# Patient Record
Sex: Female | Born: 1970 | Race: White | Hispanic: No | Marital: Married
Health system: Southern US, Community
[De-identification: ages and names within clinical notes are randomized; demographics above are authoritative.]

---

## 2004-11-16 ENCOUNTER — Ambulatory Visit: Payer: Self-pay | Admitting: Unknown Physician Specialty

## 2004-12-09 ENCOUNTER — Ambulatory Visit: Payer: Self-pay | Admitting: Unknown Physician Specialty

## 2013-06-29 ENCOUNTER — Inpatient Hospital Stay: Payer: Self-pay | Admitting: Orthopedic Surgery

## 2013-06-29 DIAGNOSIS — I10 Essential (primary) hypertension: Secondary | ICD-10-CM

## 2013-06-29 LAB — BASIC METABOLIC PANEL
Calcium, Total: 8.6 mg/dL (ref 8.5–10.1)
EGFR (African American): >60
EGFR (Non-African Amer.): >60
Osmolality: 292 (ref 275–301)
Potassium: 4.5 mmol/L (ref 3.5–5.1)
Sodium: 140 mmol/L (ref 136–145)

## 2013-06-29 LAB — CBC
HCT: 36.5 % (ref 35.0–47.0)
HGB: 12 g/dL (ref 12.0–16.0)
MCH: 30.3 pg (ref 26.0–34.0)
MCHC: 32.8 g/dL (ref 32.0–36.0)
Platelet: 425 10*3/uL (ref 150–440)
RBC: 3.95 10*6/uL (ref 3.80–5.20)
RDW: 15.7 % — ABNORMAL HIGH (ref 11.5–14.5)

## 2013-07-01 LAB — PATHOLOGY REPORT

## 2013-08-14 ENCOUNTER — Emergency Department: Payer: Self-pay | Admitting: Emergency Medicine

## 2013-08-14 LAB — BASIC METABOLIC PANEL
Anion Gap: 8 (ref 7–16)
Calcium, Total: 8.8 mg/dL (ref 8.5–10.1)
Chloride: 100 mmol/L (ref 98–107)
Co2: 28 mmol/L (ref 21–32)
EGFR (Non-African Amer.): >60
Glucose: 168 mg/dL — ABNORMAL HIGH (ref 65–99)
Potassium: 3.6 mmol/L (ref 3.5–5.1)
Sodium: 136 mmol/L (ref 136–145)

## 2013-08-14 LAB — CBC WITH DIFFERENTIAL/PLATELET
Basophil #: 0.1 10*3/uL (ref 0.0–0.1)
Eosinophil #: 0.1 10*3/uL (ref 0.0–0.7)
Eosinophil %: 1.1 %
HCT: 39.4 % (ref 35.0–47.0)
Lymphocyte #: 2.1 10*3/uL (ref 1.0–3.6)
Monocyte #: 0.6 x10 3/mm (ref 0.2–0.9)
Neutrophil #: 7.3 10*3/uL — ABNORMAL HIGH (ref 1.4–6.5)
Platelet: 598 10*3/uL — ABNORMAL HIGH (ref 150–440)
RBC: 4.46 10*6/uL (ref 3.80–5.20)

## 2013-08-31 ENCOUNTER — Ambulatory Visit: Payer: Self-pay | Admitting: Orthopedic Surgery

## 2013-09-01 ENCOUNTER — Ambulatory Visit: Payer: Self-pay | Admitting: Orthopedic Surgery

## 2013-11-16 ENCOUNTER — Ambulatory Visit: Payer: Self-pay | Admitting: Orthopedic Surgery

## 2014-02-10 IMAGING — CR RIGHT ANKLE - COMPLETE 3+ VIEW
1 series · 4 of 4 positions shown · non-contrast
Comparison: none

REASON FOR EXAM: s/p reduction
COMMENTS:

[Series 1: ap · 0.17mm/px · 4 of 4 slices shown]
[im 1/4]
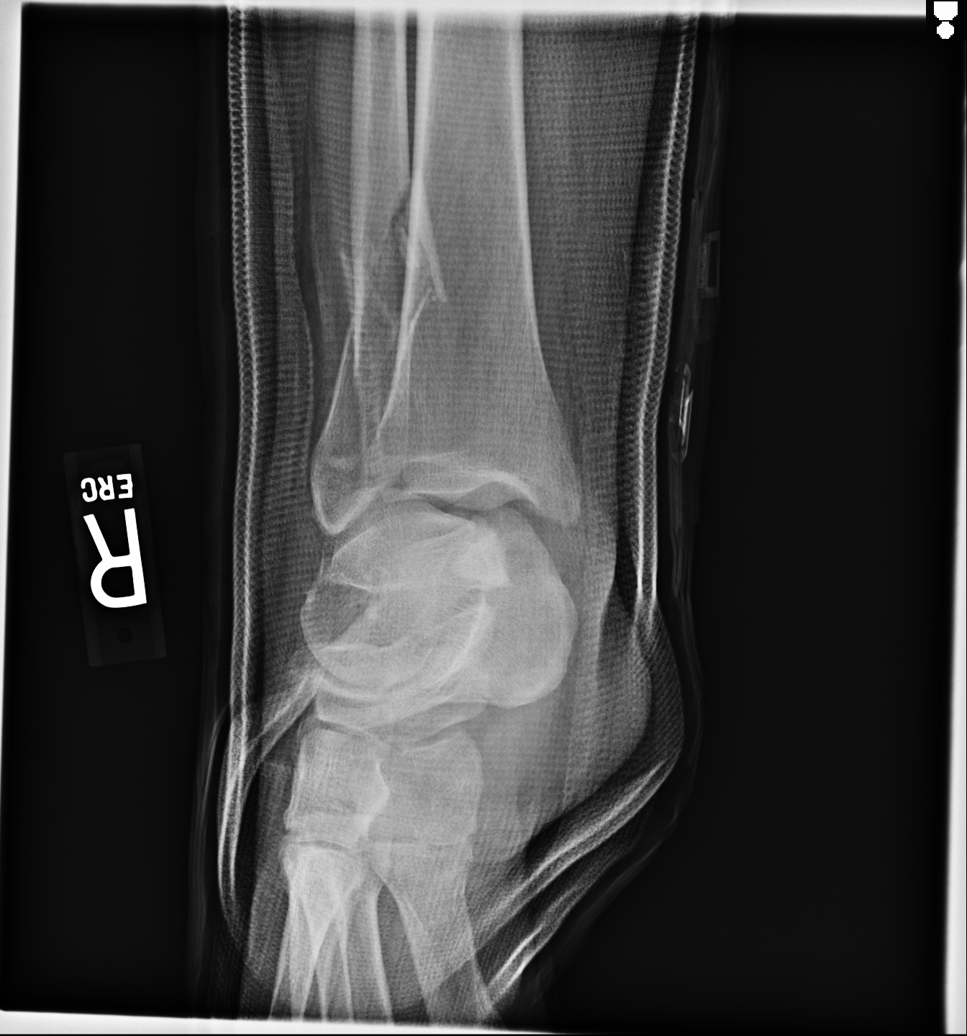
[im 2/4]
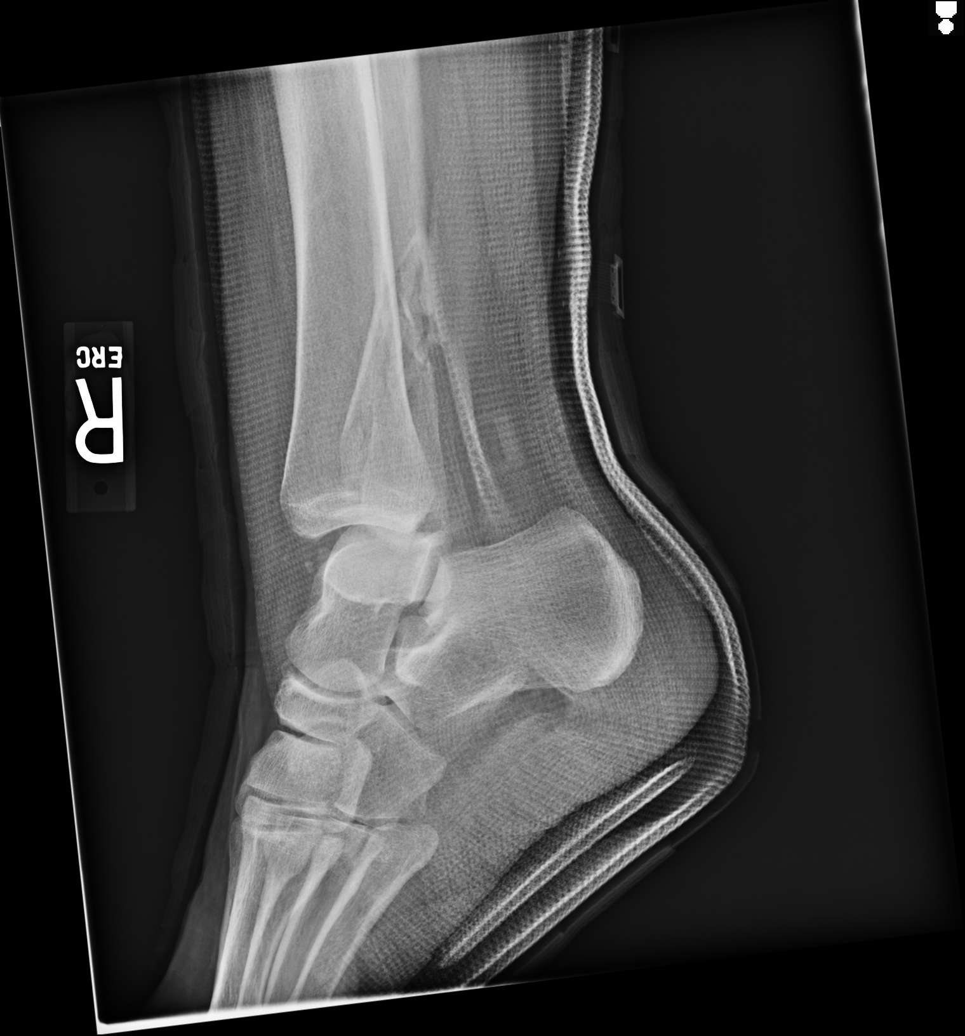
[im 3/4]
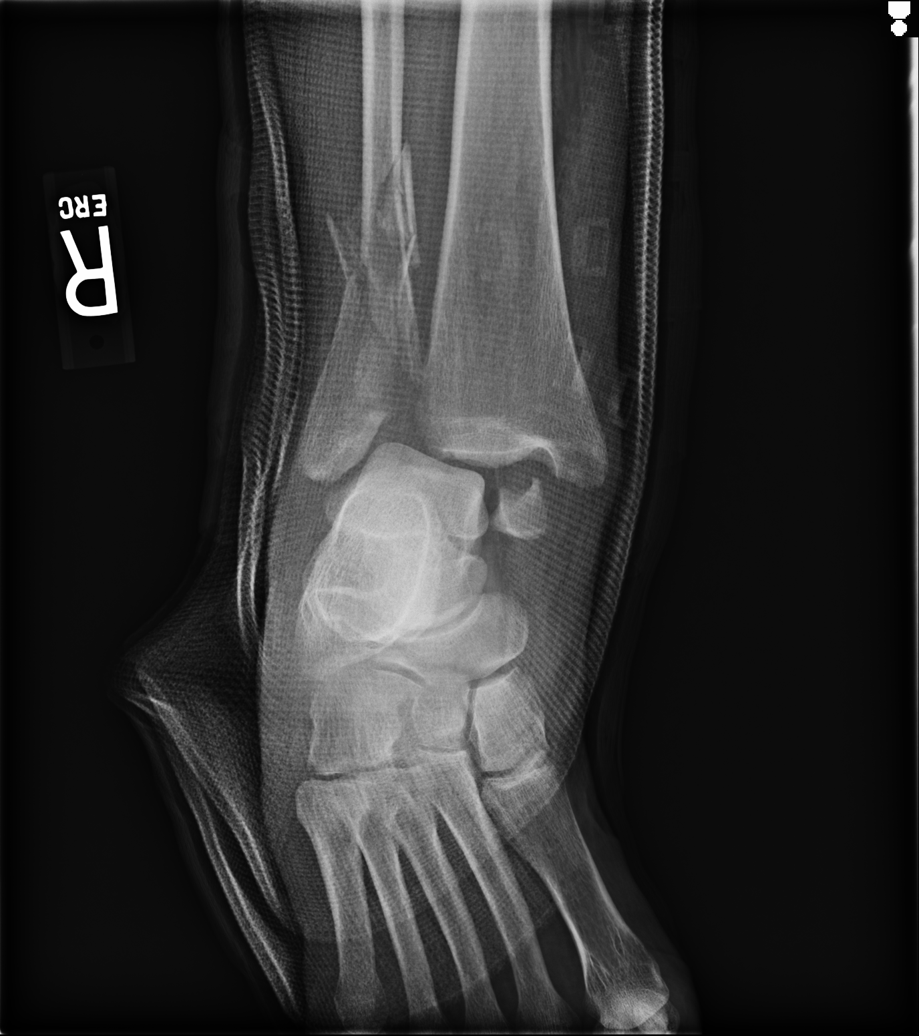
[im 4/4]
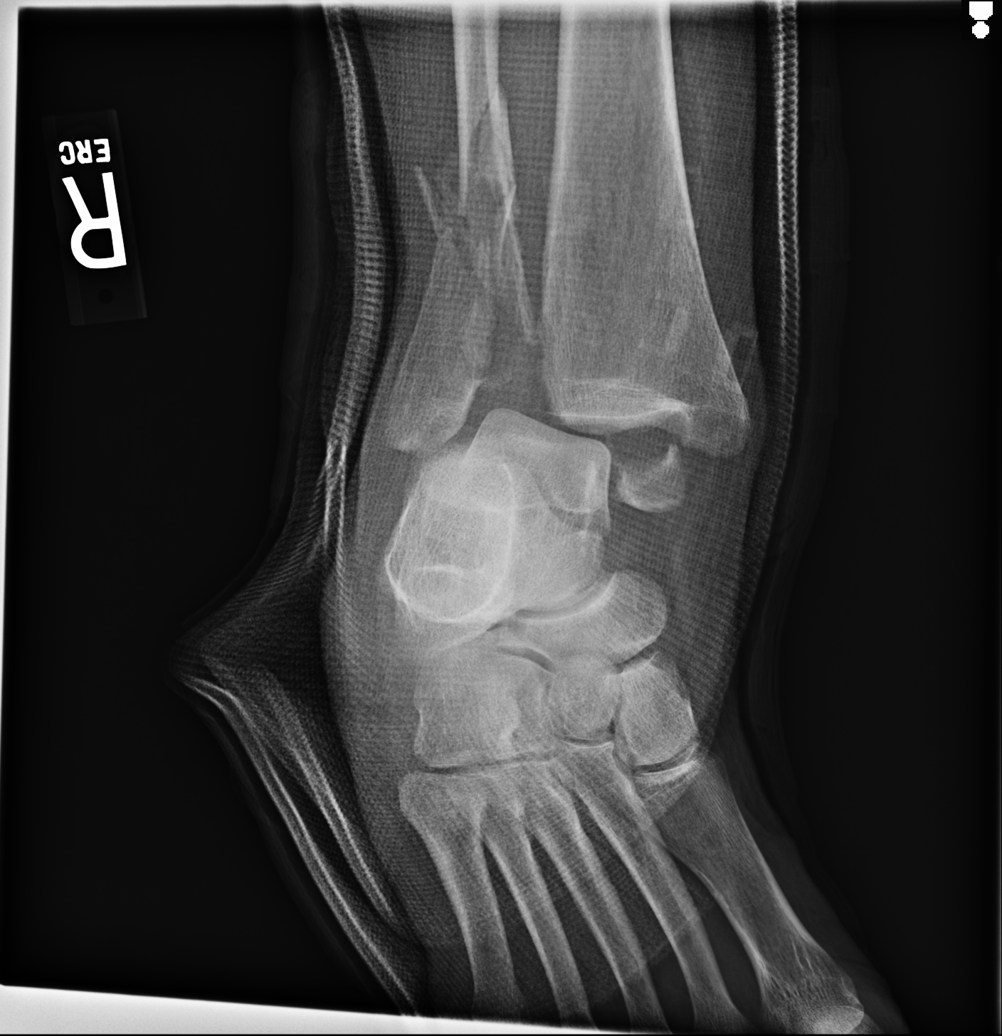

[4 of 4 positions shown; findings below may reference images not displayed]

PROCEDURE:     DXR - DXR ANKLE RIGHT COMPLETE  - June 29, 2013  [DATE]

RESULT:     In plaster views of the right ankle reveal a fracture
dislocation that is bimalleolar in nature. There is disruption of the joint
mortise. The medial malleolus is displaced by at least 2 cm from its donor
site. There is a comminuted component of the distal metadiaphyseal fracture
of the fibula. The talus and calcaneus are grossly intact as are the
metatarsal bases.
IMPRESSION: There is significant persistent dislocation and angulation
at the fracture sites following closed reduction. Further interpretation is
deferred to Dr. Kinder.

[REDACTED]

## 2014-04-15 IMAGING — CR RIGHT TIBIA AND FIBULA - 2 VIEW
1 series · 2 of 2 positions shown · non-contrast
Comparison: Comparison is made to studies August 14, 2013.

CLINICAL DATA: Postoperative films from ORIF of the distal tibia
and fibula

EXAM:
RIGHT TIBIA AND FIBULA - 2 VIEW

[Series 1: ap · 0.17mm/px · 2 of 2 slices shown]
[im 1/2]
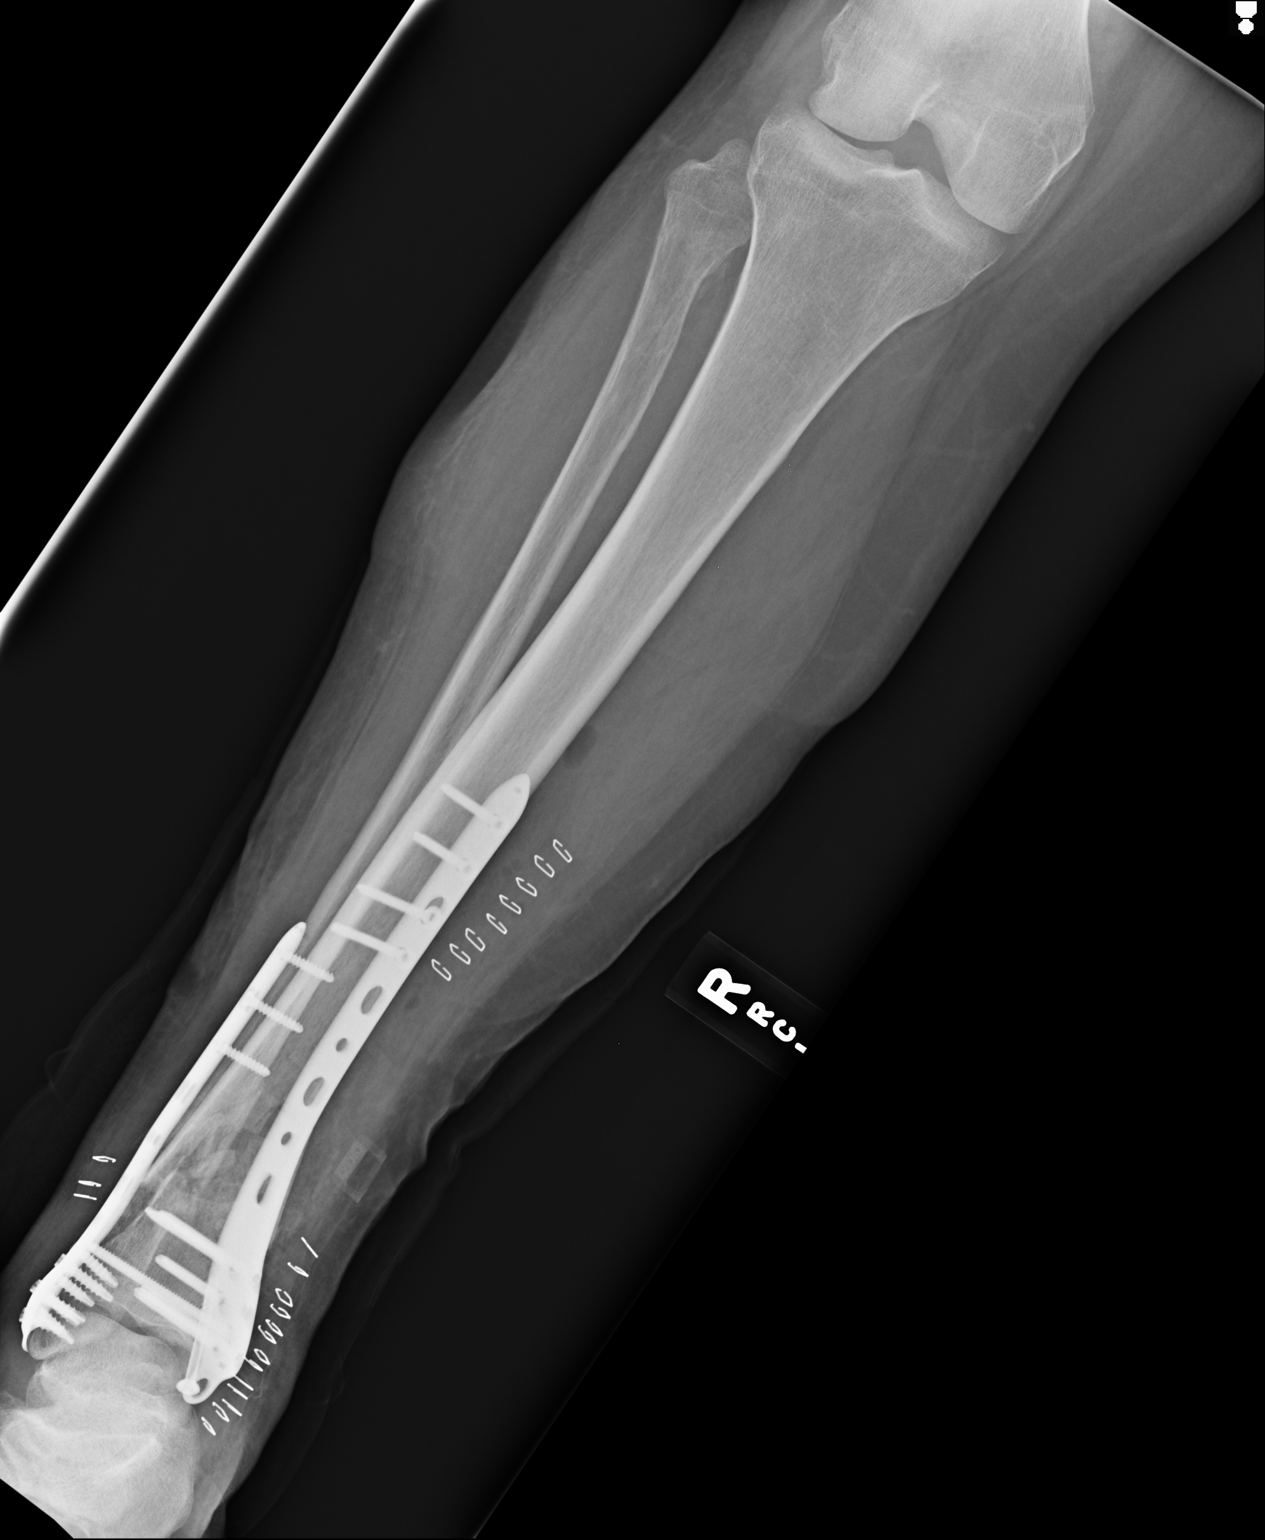
[im 2/2]
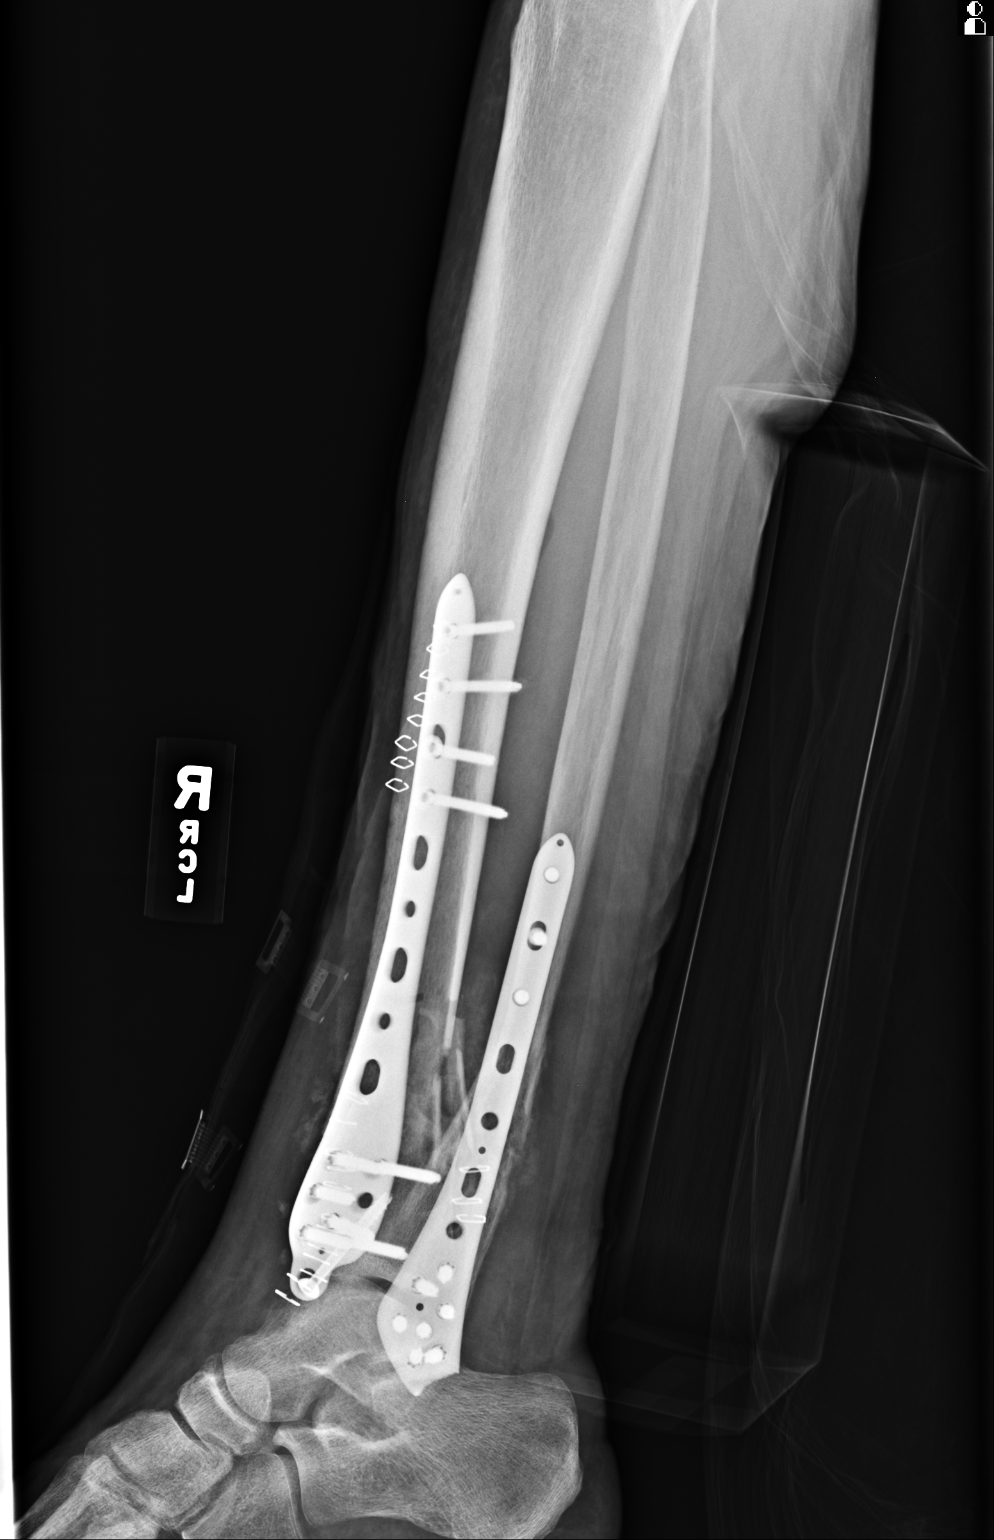

[2 of 2 positions shown; findings below may reference images not displayed]

FINDINGS: The patient has undergone previous ORIF of fractures of the distal
metadiaphysis of the fibula. The syndesmotic screw through the
sideplate of the distal fibula extending into the distal tibial
metaphysis has been removed. An orthopedic side plate has been
placed along the medial cortex of the distal tibial metadiaphysis to
stabilize the distal tibial fracture centered along the path of the
syndesmotic screw.
IMPRESSION: The patient has undergone revision of the previous ORIF of the
distal fibula and has undergone placement of a new sideplate
adjacent to the distal tibial metadiaphysis fracture. Further
interpretation is deferred to Dr. Sobze.

## 2014-05-12 ENCOUNTER — Emergency Department: Payer: Self-pay | Admitting: Emergency Medicine

## 2014-05-12 LAB — COMPREHENSIVE METABOLIC PANEL
ALBUMIN: 3.1 g/dL — AB (ref 3.4–5.0)
ALK PHOS: 161 U/L — AB
AST: 18 U/L (ref 15–37)
Anion Gap: 7 (ref 7–16)
BUN: 28 mg/dL — ABNORMAL HIGH (ref 7–18)
Bilirubin,Total: 0.1 mg/dL — ABNORMAL LOW (ref 0.2–1.0)
CHLORIDE: 105 mmol/L (ref 98–107)
Calcium, Total: 8.7 mg/dL (ref 8.5–10.1)
Co2: 25 mmol/L (ref 21–32)
Creatinine: 0.72 mg/dL (ref 0.60–1.30)
Glucose: 258 mg/dL — ABNORMAL HIGH (ref 65–99)
Osmolality: 288 (ref 275–301)
Potassium: 3.7 mmol/L (ref 3.5–5.1)
SGPT (ALT): 17 U/L
Sodium: 137 mmol/L (ref 136–145)
Total Protein: 7.5 g/dL (ref 6.4–8.2)

## 2014-05-12 LAB — URINALYSIS, COMPLETE
Bilirubin,UR: NEGATIVE
Blood: NEGATIVE
Glucose,UR: >=500 mg/dL (ref 0–75)
Ketone: NEGATIVE
Nitrite: NEGATIVE
PH: 5 (ref 4.5–8.0)
Protein: NEGATIVE
Specific Gravity: 1.027 (ref 1.003–1.030)
WBC UR: 15 /HPF (ref 0–5)

## 2014-05-12 LAB — CBC
HCT: 35.1 % (ref 35.0–47.0)
HGB: 11.1 g/dL — ABNORMAL LOW (ref 12.0–16.0)
MCH: 25.4 pg — AB (ref 26.0–34.0)
MCHC: 31.6 g/dL — ABNORMAL LOW (ref 32.0–36.0)
MCV: 81 fL (ref 80–100)
PLATELETS: 544 10*3/uL — AB (ref 150–440)
RBC: 4.36 10*6/uL (ref 3.80–5.20)
RDW: 16.3 % — AB (ref 11.5–14.5)
WBC: 8.5 10*3/uL (ref 3.6–11.0)

## 2014-05-12 LAB — TROPONIN I: Troponin-I: <0.02 ng/mL

## 2014-12-31 NOTE — Consult Note (Signed)
PATIENT NAME:  Stephanie Zuniga, Stephanie Zuniga MR#:  [id number] DATE OF BIRTH:  Jan 11, 1971  DATE OF CONSULTATION:  06/29/2013  REFERRING PHYSICIAN:   Dr. Rosita KeaMenz. CONSULTING PHYSICIAN:  Ramonita LabAruna Jivan Symanski, MD  PRIMARY CARE PHYSICIAN:  Dr. Silver HugueninAileen Miller.  REASON FOR CONSULTATION:  Ankle tibiotalar dislocation for preop clearance.   HISTORY OF PRESENT ILLNESS: The patient is a 44 year old Caucasian female with past medical history of diabetes mellitus with neuropathy, hypertension, hyperlipidemia, hypothyroidism and intermittent episodes of vertigo, takes amitriptyline, and got sleepy after taking amitriptyline during nighttime. Last night, while going to the bathroom, she was unsteady and tipped over and fell.  The patient was having right ankle pain, and came to the ER, diagnosed with right anterior tibiotalar dislocation. The patient was admitted to Dr. Neomia GlassMenz's surgery, and the hospitalist team is consulted for preop clearance. During my examination, the patient denies any chest pain, shortness of breath or abdominal pain. She denies any heart attacks in the past. She reports that she develops intermittent episodes of vertigo, but denies any today. No abdominal pain, nausea or vomiting. Denies any other complaints.   PAST MEDICAL HISTORY: Diabetes mellitus, hypertension, hyperlipidemia, hypothyroidism, intermittent episodes of vertigo.   PAST SURGICAL HISTORY: Appendectomy, 2 C-sections, hysterectomy, tonsillectomy, gastric bypass.   SHE IS ALLERGIC NEURONTIN, SULFA, TETRACYCLINE, TAPE.   PSYCHOSOCIAL HISTORY: Lives at home with husband and children. Denies history of smoking. Occasional intake of alcohol. Denies any illicit drug usage.   FAMILY HISTORY: Mother has history of diabetes mellitus.   HOME MEDICATIONS:  Synthroid 50 mcg p.o. once daily, pravastatin 40 mg p.o. once daily, NovoLog, losartan 25 mg once daily, Lantus 36 units subcutaneous once daily, Colace 100 mg p.o. 2 times a day, citalopram 40 mg once daily,  aspirin 81 mg once daily.   REVIEW OF SYSTEMS:   CONSTITUTIONAL: Denies any fever, fatigue.  EYES: Denies blurry vision, glaucoma.  ENT: Denies epistaxis, discharge.  RESPIRATORY: Denies cough, COPD.  CARDIOVASCULAR: No chest pain, syncope, palpitations.  GASTROINTESTINAL: Denies nausea, vomiting, diarrhea.  GENITOURINARY: No dysuria or hematuria.  GYNECOLOGIC AND BREASTS:  Denies breast mass or vaginal discharge, status post hysterectomy.  ENDOCRINE: Denies polyuria. Has a chronic history of diabetes mellitus and diabetic neuropathy.  HEMATOLOGIC AND LYMPHATIC: Denies anemia or easy bruising.  INTEGUMENTARY: No acne, rash, lesions.  MUSCULOSKELETAL: Complaining of right ankle pain status post fall. Denies gout.  NEUROLOGIC:  Currently denies vertigo. No ataxia.  PSYCHIATRIC: No ADD, OCD.   PHYSICAL EXAMINATION: VITAL SIGNS: Temperature 98.7, pulse 105, respirations 18, blood pressure 149/75, pulse ox 98%.  GENERAL APPEARANCE: Not in acute distress. Moderately built and nourished.  HEENT: Normocephalic, atraumatic. Pupils are equally reacting to light and accommodation. No scleral icterus. No conjunctival injection. No sinus tenderness. No postnasal drip. Moist mucous membranes.  NECK: Supple. No JVD or thyromegaly.  LUNGS AND CHEST: Clear to auscultation bilaterally. No accessory muscle use. There is no anterior chest wall tenderness on palpation.  CARDIOVASCULAR: S1, S2 normal. Regular rate and rhythm. No murmur.   GASTROINTESTINAL: Soft. Bowel sounds are positive in all 4 quadrants. Nontender, nondistended. No hepatosplenomegaly. No masses.  NEUROLOGIC: Awake, alert, oriented x 3. Motor and sensory are grossly intact except for right ankle area. Peripheral pulses are 2+.  EXTREMITIES: Right ankle is swollen.  PSYCHIATRIC: Normal mood and affect.  SKIN: Warm to touch. Normal turgor. No rashes. No lesions.   LABS AND IMAGING STUDIES: Glucose 338, BUN 11, creatinine 0.7, sodium 140,  potassium 4.6, chloride 109, CO2 of  26, anion gap 5, serum osmolality 292, calcium 8.68. GFR is greater than 60. CBC is normal. PT 12.3. INR is 0.9, activated PTT 24.8.  CHEST X-RAY: No acute findings. A 12-lead EKG is pending.   ASSESSMENT AND PLAN:  A 44 year old female presenting to the ER after she sustained a fall and diagnosed with right ankle tibiotalar dislocation, admitted to Dr. Neomia Glass surgery.  Medical consult is placed for preop clearance. The patient can be cleared for surgery after checking EKG, which is still pending at this point of time. I have  requested to get this EKG done as soon as possible.  1.  Right tibiotalar dislocation, management per Dr. Rosita Kea. The patient is n.p.o. and anticipating surgery today at 2:00 p.Zuniga.  2.  Diabetes mellitus with neuropathy. The patient is currently n.p.o. and on IV fluids. We will provide her insulin sliding scale and hold insulin, as the patient is n.p.o.   3.  Hypertension. Blood pressure is stable at this time. We will provide her IV Lopressor on as needed basis, as the patient is n.p.o.  4.  Hypothyroidism. We will resume Synthroid once the patient starts taking p.o.     No history of coronary artery disease.   DVT prophylaxis will be provided after surgery.   She is full code.   Her husband is medical power of attorney. Plan of care was discussed with the patient. She is aware.   Thank you, Dr. Rosita Kea, for allowing [facility] to take care of this patient.   Total time spent on admission is 45 minutes.   ____________________________ Ramonita Lab, MD ag:dmm D: 06/29/2013 08:11:34 ET T: 06/29/2013 10:24:55 ET JOB#: [id number]  cc: Ramonita Lab, MD, <Dictator> Ramonita Lab MD ELECTRONICALLY SIGNED 07/15/2013 7:21

## 2014-12-31 NOTE — Op Note (Signed)
PATIENT NAME:  Stephanie Zuniga, Stephanie M MR#:  [id number] DATE OF BIRTH:  22-Apr-1971  DATE OF PROCEDURE:  09/01/2013  PREOPERATIVE DIAGNOSIS: Loose and painful hardware right ankle with distal tibia fracture.   POSTOPERATIVE DIAGNOSIS: Loose and painful hardware right ankle with distal tibia fracture.   PROCEDURE: Removal of syndesmotic screw and open reduction internal fixation right distal tibia.   SURGEON: Kennedy BuckerMichael Madeline Pho, M.D.   ANESTHESIA: General.   DESCRIPTION OF PROCEDURE: The patient was brought to the operating room, and after adequate anesthesia was obtained, the leg was prepped and draped in usual sterile fashion with a tourniquet applied to the upper thigh. Initial part of the procedure involved making a small incision laterally. After patient identification and timeout procedures were completed, skin and subcutaneous tissue were divided and prior syndesmotic screw was removed without difficulty. Next, there had been some deformity to the distal fibula. Even with the plate in place, this was fixed with closed means putting valgus stress on the distal fibula, helping straighten the plate and reduced the distal metaphyseal fracture of the tibia. Medial incision was then made over the tibia and subcutaneous tissue elevated. A locking medial tibial plate was then slid subcutaneously up along the shaft and when it was reduced to the medial malleolar side a nonlocking screw was inserted to compress the plate against the distal tibia checking proximally to make sure the plate was over the tibia. The remaining screw holes were filled using locking screws, drilling, measuring and placing the locking screws. One screw was left open as it interfered with the previously placed medial malleolar screw. Going proximally a small incision was made and at this point the tourniquet was raised for a few minutes to minimize bleeding. The 4 proximal screw holes were filled using standard technique, drilling, measuring and placing  the nonlocking cortical screws. Following this the initial nonlocking screw was changed out for a locking screw to get a more rigid construct distally. C-arm was utilized during the procedure for visualization with postop x-rays taken in recovery. After all hardware was in place, there was no instability to stress views, the wounds were irrigated and then closed with 2-0 Vicryl subcutaneously and skin staples. Xeroform, 4 x 4's, Webril, and an Ace wrap were applied. The patient was sent to the recovery room in stable condition.   ESTIMATED BLOOD LOSS: 50 mL.   TOURNIQUET TIME: 6 minutes at 300 mmHg.   COMPLICATIONS: None.   SPECIMEN: None.  IMPLANTS: [employer] locking distal tibia plate with multiple screws. ____________________________ Stephanie SchullerMichael J. Annebelle Bostic, MD mjm:sb D: 09/01/2013 16:15:17 ET T: 09/01/2013 17:02:46 ET JOB#: [id number]  cc: Stephanie SchullerMichael J. Anamaria Dusenbury, MD, <Dictator> Stephanie SchullerMICHAEL J Ariyanna Oien MD ELECTRONICALLY SIGNED 09/02/2013 6:42

## 2014-12-31 NOTE — Discharge Summary (Signed)
PATIENT NAME:  Stephanie Zuniga, Onnie M MR#:  [id number] DATE OF BIRTH:  10/12/1970  DATE OF ADMISSION:  06/29/2013 DATE OF DISCHARGE:  07/02/2013  ADMITTING DIAGNOSIS: Right bimalleolar ankle fracture.   DISCHARGE DIAGNOSIS: Right bimalleolar ankle fracture.    SURGERY: On 06/29/2013, she had an open reduction and internal fixation of the right ankle.   SURGEON: Leitha SchullerMichael J. Menz, MD   ANESTHESIA: Spinal.   ESTIMATED BLOOD LOSS: 50 mL.  TOURNIQUET TIME: 66 minutes at 300 mmHg.   IMPLANTS: [employer] ALPS locking fibular plate, 4.0 cannulated screw medially.   COMPLICATIONS: None.   HISTORY: Ms. Stephanie Zuniga is a 44 year old female who fell while at home and had an obvious deformity of her ankle. She is a labile diabetic and has peripheral neuropathy. She understood the risks and benefits of surgery and elected to proceed with an ORIF of her right ankle.   PHYSICAL EXAMINATION:  GENERAL: Well-developed, well-nourished white female, who appears her stated age, in no acute distress.  HEENT: Unremarkable.  LUNGS: Clear.  HEART: Regular rate and rhythm.  ABDOMEN: Soft, nontender.  EXTREMITIES: Skin is intact to the right lower extremity. Palpable pulses.   HOSPITAL COURSE: After initial admission on 06/29/2013, the patient underwent an ORIF of her right ankle the same day. She had good pain control afterwards and was transported to the orthopedic floor from the PACU. On postoperative day one, 06/30/2013, she had good pain control. Physical therapy was begun on that day, but she had slow progress and was unsteady with using crutches. On postoperative day two, 07/01/2013, she continued with physical therapy and had labile blood sugars from 78 to 391. She had a BM this day, was on room air, and T-max of 99.7. She continued to have slow progress with physical therapy. On postoperative day three, 07/02/2013, she made a little bit more progress with physical therapy. She continued to have labile blood sugars from 39 to  327. T-max was the same at 99.7. Dr. Rosita KeaMenz applied a short leg cast. She is stable and ready for discharge.    CONDITION AT DISCHARGE: Stable.   DISPOSITION: The patient was sent home.   DISCHARGE INSTRUCTIONS: The patient [facility] in 2 to 3 days, where her cast will come off for a skin check. She will do physical therapy and is toe-touch weight-bearing on the right lower extremity. Her diet is a diabetic diet. Left TED hose knee-high. Please see discharge instructions for a list of discharge medications.    ____________________________ Ren Aspinall M. Haskel KhanBerndt, NP amb:lb D: 07/02/2013 14:00:02 ET T: 07/02/2013 14:09:41 ET JOB#: [id number]  cc: Adya Wirz M. Haskel KhanBerndt, NP, <Dictator> Burt EkAPRIL M Madisan Bice FNP ELECTRONICALLY SIGNED 07/17/2013 9:49

## 2014-12-31 NOTE — Op Note (Signed)
PATIENT NAME:  Stephanie Zuniga, Stephanie Zuniga MR#:  [id number] DATE OF BIRTH:  [birth year]  DATE OF PROCEDURE:  06/29/2013  PREOPERATIVE DIAGNOSIS: Bimalleolar ankle fracture-dislocation.   POSTOPERATIVE DIAGNOSIS:  Bimalleolar ankle fracture-dislocation.  ANESTHESIA: General.   SURGEON: Kennedy BuckerMichael Kell Ferris, Zuniga.D.   DESCRIPTION OF PROCEDURE: The patient was brought to the operating room and after adequate anesthesia was obtained, the right leg was prepped and draped in the usual sterile fashion. Prior to the surgery, a previous cast was removed, and there was inadvertent laceration to the skin proximal to the medial malleolar fracture, approximately 2 cm in length. It went through the skin, and this was cleansed during the preoperative prep. The fracture was quite unstable. After prepping and draping in the usual sterile fashion, appropriate patient identification and timeout procedures were completed. The previous laceration described was extended and the medial malleolar fracture exposed. The wound was thoroughly irrigated with a liter of saline with pulsatile lavage. There were multiple cartilaginous fragments from the joint, which were removed at this point and sent as specimen with significant articular damage to the talus being visible through the medial wound. With the medial side opened,  the fracture could be reduced, but it was still quite unstable, and so the lateral side was opened after raising the tourniquet. This was all done after appropriate patient identification and timeout procedures were completed. A lateral skin incision was made over the distal fibula. With the distal fibula exposed, a plate from the A.L.P.S. fibular plating system from [employer] was obtained. The plate was slid subcutaneously and visualized on AP and lateral images proximally. Proximal incision was then made and 3 cortical screws were placed proximally after pinning it distally. The plate was fixed proximally and distally without exposure of the  actual fracture sites to try to minimize the soft tissue disruption of the fracture site. The proximal screws were all nonlocking cortical screws. This brought the plate down to the fibula distally. An initial nonlocking screw was placed to get the plate down adjacent to the fibula, then locking screws were placed in the remaining screw holes using standard technique of drilling, removing the drill guide then measuring and placing the locking cancellus screws distally. When all of these had been placed, the initial nonlocking cortical screws were placed with a locking cancellus screw. FluoroScan was used during the procedure, and it showed no penetration into the joint. After this was completed, the ankle was stressed, and there was obvious opening of the syndesmosis with syndesmosis rupture, so a syndesmotic screw was placed through an intervening hole between the 2 sets of screws; over drilling the fibula with the 3.5 screw, and then tibia with the 2.5 mm screw. Placing a 45 mm cortical screw, there was good compression of the mortise with the ankle in dorsiflexion to prevent overtightening of the mortise, and following this screw placement, there was excellent stability to valgus stress with no more widening of the mortise. At this point, the medial side was fairly well reduced with a dental pick. Holding the medial malleolar fragment in a reduced position, a guidewire was inserted, and this was drilled through the medial malleolar fragment with a 34 mm partially threaded cancellus screw, which gave excellent stability to the medial malleolar fragment and anatomic alignment. At this point, all wounds were thoroughly irrigated, closed with 2-0 Vicryl subcutaneously and skin staples. Xeroform was applied over the wounds medially and laterally, followed by 4 x 4's, ABD, Webril and a stirrup splint with 3 x 12  inch splints. Ace wrap then applied and tourniquet let down.   TOURNIQUET TIME: 66 minutes at 300 mmHg.    There were no complications.    SPECIMEN REMOVED:  Cartilage fragments from the ankle joint showing significant articular damage to the ankle joint.   IMPLANTS: Biomet A.L.P.S. fibular locking plate with multiple screws and cannulated screw from [employer] 34 mm in length.     ____________________________ Leitha Schuller, MD mjm:dmm D: 06/29/2013 21:58:12 ET T: 06/29/2013 22:59:55 ET JOB#: [id number]  cc: Leitha Schuller, MD, <Dictator> Nolon Bussing [facility] MD ELECTRONICALLY SIGNED 06/30/2013 7:28

## 2014-12-31 NOTE — H&P (Signed)
PATIENT NAME:  Stephanie Zuniga, [name] MR#:  [id number] DATE OF BIRTH:  December 24, 1970  DATE OF ADMISSION:  06/29/2013  CHIEF COMPLAINT: Right ankle deformity.   HISTORY: The patient is a 44 year old, who stumbled at home. She had obvious deformity to her foot. She suffers from severe peripheral neuropathy and did not actually have significant pain with this and she is being admitted for treatment of this. She has no history of any prior Charcot type injuries or deformities. She [place] Dr. Rubye OaksPalmer and her sugars apparently been out of control for some time. She has been diabetic since age 44. She reports her last hemoglobin A1c was over 13 and prior to that was not recordable. She is a non-smoker.   PAST SURGICAL HISTORY: She has prior gastric bypass and cholecystectomy as well as hysterectomy.  MEDICATIONS ON ADMISSION: Synthroid 50 mcg daily, pravastatin 40 mg at bedtime, NovoLog 1 unit per 10 grams of carbs, 1 unit for 25 mg with an Accu-Chek over 150, Maxalt 5 mg daily as needed for headache, losartan 25 mg daily, Lantus 36 units at bedtime, Colace 100 mg b.i.d., vitamin B12 1000 mg injected monthly, citalopram 40 mg daily and aspirin 81 mg daily, amitriptyline 100 mg at night.  ALLERGIES: SHE HAS ALLERGIES TO NEURONTIN, SULFA, TETRACYCLINE AND TAPE ALLERGY CAUSING BLISTERS.  FAMILY HISTORY: Noncontributory.  REVIEW OF SYSTEMS: Negative.   PHYSICAL EXAMINATION:  GENERAL: Well-developed, well-nourished white female, who appears her stated age, in no acute distress.  HEENT: Unremarkable.  LUNGS: Clear.  HEART: Regular rate and rhythm.  ABDOMEN: Soft, nontender.  EXTREMITIES: Her skin is intact to the right lower extremity. Prior splint was removed and a new cast was applied to improve alignment. This was not painful. She does have palpable pulses. Skin is intact.   X-rays reveal a very comminuted distal fibula fracture along with a medial malleolar  fracture.  CLINICAL IMPRESSION: Bimalleolar ankle fracture-dislocation in a patient with significant  peripheral neuropathy.   RECOMMENDATION: ORIF. Discussed the risks, benefits, possible complications, in particular infection with her uncontrolled diabetes, concerned about wound healing, need to keep her leg elevated. She will be nonweightbearing for at least 8 weeks and expect slow healing secondary to her diabetes and neuropathy. Also discussed that if it does not heal she could end requiring amputation. She understands this. We will plan on surgery later today. Preoperative medical consult ordered.    ____________________________ Leitha SchullerMichael J. Dennison Mcdaid, MD mjm:aw D: 06/29/2013 07:17:29 ET T: 06/29/2013 07:27:43 ET JOB#: [id number]  cc: Leitha SchullerMichael J. Samyrah Bruster, MD, <Dictator> Leitha SchullerMICHAEL J Deisi Salonga MD ELECTRONICALLY SIGNED 06/29/2013 7:48
